# Patient Record
Sex: Male | Born: 1975 | Race: White | Hispanic: No | Marital: Married | Smoking: Former smoker
Health system: Southern US, Community
[De-identification: ages and names within clinical notes are randomized; demographics above are authoritative.]

## PROBLEM LIST (undated history)

## (undated) DIAGNOSIS — K219 Gastro-esophageal reflux disease without esophagitis: Secondary | ICD-10-CM

## (undated) DIAGNOSIS — E7800 Pure hypercholesterolemia, unspecified: Secondary | ICD-10-CM

## (undated) DIAGNOSIS — K760 Fatty (change of) liver, not elsewhere classified: Secondary | ICD-10-CM

## (undated) DIAGNOSIS — G4700 Insomnia, unspecified: Secondary | ICD-10-CM

## (undated) HISTORY — DX: Insomnia, unspecified: G47.00

## (undated) HISTORY — DX: Pure hypercholesterolemia, unspecified: E78.00

## (undated) HISTORY — DX: Fatty (change of) liver, not elsewhere classified: K76.0

---

## 1997-08-20 ENCOUNTER — Emergency Department (HOSPITAL_COMMUNITY): Admission: EM | Admit: 1997-08-20 | Discharge: 1997-08-21 | Payer: Self-pay | Admitting: Emergency Medicine

## 2005-02-13 ENCOUNTER — Emergency Department (HOSPITAL_COMMUNITY): Admission: EM | Admit: 2005-02-13 | Discharge: 2005-02-13 | Payer: Self-pay | Admitting: Emergency Medicine

## 2019-02-22 ENCOUNTER — Emergency Department (HOSPITAL_COMMUNITY): Payer: Commercial Managed Care - PPO | Admitting: Certified Registered"

## 2019-02-22 ENCOUNTER — Emergency Department (HOSPITAL_BASED_OUTPATIENT_CLINIC_OR_DEPARTMENT_OTHER): Payer: Commercial Managed Care - PPO

## 2019-02-22 ENCOUNTER — Other Ambulatory Visit: Payer: Self-pay

## 2019-02-22 ENCOUNTER — Observation Stay (HOSPITAL_BASED_OUTPATIENT_CLINIC_OR_DEPARTMENT_OTHER)
Admission: EM | Admit: 2019-02-22 | Discharge: 2019-02-23 | Disposition: A | Discharge status: Home / Self Care | Payer: Commercial Managed Care - PPO | Attending: General Surgery | Admitting: General Surgery

## 2019-02-22 ENCOUNTER — Encounter (HOSPITAL_COMMUNITY)
Admission: EM | Disposition: A | Discharge status: Home / Self Care | Payer: Self-pay | Source: Home / Self Care | Attending: Emergency Medicine

## 2019-02-22 ENCOUNTER — Encounter (HOSPITAL_BASED_OUTPATIENT_CLINIC_OR_DEPARTMENT_OTHER): Payer: Self-pay | Admitting: General Surgery

## 2019-02-22 DIAGNOSIS — K3580 Unspecified acute appendicitis: Principal | ICD-10-CM

## 2019-02-22 DIAGNOSIS — R109 Unspecified abdominal pain: Secondary | ICD-10-CM | POA: Diagnosis present

## 2019-02-22 DIAGNOSIS — K219 Gastro-esophageal reflux disease without esophagitis: Secondary | ICD-10-CM | POA: Diagnosis not present

## 2019-02-22 DIAGNOSIS — R1031 Right lower quadrant pain: Secondary | ICD-10-CM

## 2019-02-22 DIAGNOSIS — R110 Nausea: Secondary | ICD-10-CM

## 2019-02-22 DIAGNOSIS — Z20828 Contact with and (suspected) exposure to other viral communicable diseases: Secondary | ICD-10-CM | POA: Diagnosis not present

## 2019-02-22 DIAGNOSIS — Z87891 Personal history of nicotine dependence: Secondary | ICD-10-CM | POA: Insufficient documentation

## 2019-02-22 HISTORY — DX: Gastro-esophageal reflux disease without esophagitis: K21.9

## 2019-02-22 HISTORY — PX: LAPAROSCOPIC APPENDECTOMY: SHX408

## 2019-02-22 LAB — CBC WITH DIFFERENTIAL/PLATELET
Abs Immature Granulocytes: 0.03 10*3/uL (ref 0.00–0.07)
Basophils Absolute: 0.1 10*3/uL (ref 0.0–0.1)
Basophils Relative: 1 %
Eosinophils Absolute: 0.2 10*3/uL (ref 0.0–0.5)
Eosinophils Relative: 1 %
HCT: 44.5 % (ref 39.0–52.0)
Hemoglobin: 15.5 g/dL (ref 13.0–17.0)
Immature Granulocytes: 0 %
Lymphocytes Relative: 30 %
Lymphs Abs: 3.4 10*3/uL (ref 0.7–4.0)
MCH: 30.6 pg (ref 26.0–34.0)
MCHC: 34.8 g/dL (ref 30.0–36.0)
MCV: 87.8 fL (ref 80.0–100.0)
Monocytes Absolute: 1 10*3/uL (ref 0.1–1.0)
Monocytes Relative: 9 %
Neutro Abs: 6.8 10*3/uL (ref 1.7–7.7)
Neutrophils Relative %: 59 %
Platelets: 376 10*3/uL (ref 150–400)
RBC: 5.07 MIL/uL (ref 4.22–5.81)
RDW: 11.8 % (ref 11.5–15.5)
WBC: 11.4 10*3/uL — ABNORMAL HIGH (ref 4.0–10.5)
nRBC: 0 % (ref 0.0–0.2)

## 2019-02-22 LAB — COMPREHENSIVE METABOLIC PANEL
ALT: 93 U/L — ABNORMAL HIGH (ref 0–44)
AST: 43 U/L — ABNORMAL HIGH (ref 15–41)
Albumin: 4.5 g/dL (ref 3.5–5.0)
Alkaline Phosphatase: 70 U/L (ref 38–126)
Anion gap: 11 (ref 5–15)
BUN: 9 mg/dL (ref 6–20)
CO2: 26 mmol/L (ref 22–32)
Calcium: 9.5 mg/dL (ref 8.9–10.3)
Chloride: 101 mmol/L (ref 98–111)
Creatinine, Ser: 0.93 mg/dL (ref 0.61–1.24)
GFR calc Af Amer: >60 mL/min (ref >60)
GFR calc non Af Amer: >60 mL/min (ref >60)
Glucose, Bld: 106 mg/dL — ABNORMAL HIGH (ref 70–99)
Potassium: 4.1 mmol/L (ref 3.5–5.1)
Sodium: 138 mmol/L (ref 135–145)
Total Bilirubin: 1.5 mg/dL — ABNORMAL HIGH (ref 0.3–1.2)
Total Protein: 8.2 g/dL — ABNORMAL HIGH (ref 6.5–8.1)

## 2019-02-22 LAB — LIPASE, BLOOD: Lipase: 27 U/L (ref 11–51)

## 2019-02-22 LAB — RESPIRATORY PANEL BY RT PCR (FLU A&B, COVID)
Influenza A by PCR: NEGATIVE
Influenza B by PCR: NEGATIVE
SARS Coronavirus 2 by RT PCR: NEGATIVE

## 2019-02-22 LAB — SARS CORONAVIRUS 2 AG (30 MIN TAT): SARS Coronavirus 2 Ag: NEGATIVE

## 2019-02-22 LAB — URINALYSIS, ROUTINE W REFLEX MICROSCOPIC
Bilirubin Urine: NEGATIVE
Glucose, UA: NEGATIVE mg/dL
Hgb urine dipstick: NEGATIVE
Ketones, ur: NEGATIVE mg/dL
Leukocytes,Ua: NEGATIVE
Nitrite: NEGATIVE
Protein, ur: NEGATIVE mg/dL
Specific Gravity, Urine: 1.02 (ref 1.005–1.030)
pH: 6 (ref 5.0–8.0)

## 2019-02-22 SURGERY — APPENDECTOMY, LAPAROSCOPIC
Anesthesia: General

## 2019-02-22 MED ORDER — PROPOFOL 10 MG/ML IV BOLUS
INTRAVENOUS | Status: DC | PRN
  Administered 2019-02-22: 200 mg via INTRAVENOUS

## 2019-02-22 MED ORDER — SUCCINYLCHOLINE CHLORIDE 200 MG/10ML IV SOSY
PREFILLED_SYRINGE | INTRAVENOUS | Status: DC | PRN
  Administered 2019-02-22: 100 mg via INTRAVENOUS

## 2019-02-22 MED ORDER — ONDANSETRON HCL 4 MG/2ML IJ SOLN
INTRAMUSCULAR | Status: DC | PRN
  Administered 2019-02-22: 4 mg via INTRAVENOUS

## 2019-02-22 MED ORDER — FENTANYL CITRATE (PF) 250 MCG/5ML IJ SOLN
INTRAMUSCULAR | Status: AC
  Filled 2019-02-22: qty 5

## 2019-02-22 MED ORDER — IOHEXOL 300 MG/ML  SOLN
100.0000 mL | Freq: Once | INTRAMUSCULAR | Status: AC | PRN
  Administered 2019-02-22: 12:00:00 100 mL via INTRAVENOUS

## 2019-02-22 MED ORDER — FENTANYL CITRATE (PF) 100 MCG/2ML IJ SOLN: 25.0000 ug | INTRAMUSCULAR | Status: DC | PRN

## 2019-02-22 MED ORDER — SENNOSIDES-DOCUSATE SODIUM 8.6-50 MG PO TABS: 1.0000 | ORAL_TABLET | Freq: Every day | ORAL | Status: DC

## 2019-02-22 MED ORDER — SUGAMMADEX SODIUM 200 MG/2ML IV SOLN
INTRAVENOUS | Status: DC | PRN
  Administered 2019-02-22: 200 mg via INTRAVENOUS

## 2019-02-22 MED ORDER — MORPHINE SULFATE (PF) 2 MG/ML IV SOLN
2.0000 mg | Freq: Once | INTRAVENOUS | Status: AC
  Administered 2019-02-22: 2 mg via INTRAVENOUS
  Filled 2019-02-22: qty 1

## 2019-02-22 MED ORDER — ONDANSETRON HCL 4 MG/2ML IJ SOLN
4.0000 mg | Freq: Once | INTRAMUSCULAR | Status: AC
  Administered 2019-02-22: 4 mg via INTRAVENOUS
  Filled 2019-02-22: qty 2

## 2019-02-22 MED ORDER — BISACODYL 10 MG RE SUPP: 10.0000 mg | Freq: Every day | RECTAL | Status: DC | PRN

## 2019-02-22 MED ORDER — HYDROMORPHONE HCL 1 MG/ML IJ SOLN
INTRAMUSCULAR | Status: DC | PRN
  Administered 2019-02-22: .5 mg via INTRAVENOUS
  Administered 2019-02-22: 1 mg via INTRAVENOUS

## 2019-02-22 MED ORDER — LACTATED RINGERS IR SOLN
Status: DC | PRN
  Administered 2019-02-22: 1000 mL

## 2019-02-22 MED ORDER — OXYCODONE HCL 5 MG PO TABS: 5.0000 mg | ORAL_TABLET | Freq: Once | ORAL | Status: DC | PRN

## 2019-02-22 MED ORDER — OXYCODONE HCL 5 MG/5ML PO SOLN: 5.0000 mg | Freq: Once | ORAL | Status: DC | PRN

## 2019-02-22 MED ORDER — PHENYLEPHRINE 40 MCG/ML (10ML) SYRINGE FOR IV PUSH (FOR BLOOD PRESSURE SUPPORT)
PREFILLED_SYRINGE | INTRAVENOUS | Status: DC | PRN
  Administered 2019-02-22 (×2): 120 ug via INTRAVENOUS

## 2019-02-22 MED ORDER — BUPIVACAINE HCL (PF) 0.25 % IJ SOLN
INTRAMUSCULAR | Status: AC
  Filled 2019-02-22: qty 30

## 2019-02-22 MED ORDER — HYDROCODONE-ACETAMINOPHEN 5-325 MG PO TABS
1.0000 | ORAL_TABLET | ORAL | Status: DC | PRN
  Administered 2019-02-23: 11:00:00 1 via ORAL
  Administered 2019-02-23: 2 via ORAL
  Filled 2019-02-22: qty 2
  Filled 2019-02-22: qty 1

## 2019-02-22 MED ORDER — METRONIDAZOLE IN NACL 5-0.79 MG/ML-% IV SOLN
500.0000 mg | Freq: Once | INTRAVENOUS | Status: AC
  Administered 2019-02-22: 14:00:00 500 mg via INTRAVENOUS
  Filled 2019-02-22: qty 100

## 2019-02-22 MED ORDER — MORPHINE SULFATE (PF) 2 MG/ML IV SOLN
2.0000 mg | INTRAVENOUS | Status: DC | PRN
  Administered 2019-02-23: 07:00:00 4 mg via INTRAVENOUS
  Filled 2019-02-22: qty 2

## 2019-02-22 MED ORDER — SODIUM CHLORIDE 0.9 % IV SOLN
2.0000 g | Freq: Once | INTRAVENOUS | Status: AC
  Administered 2019-02-22: 13:00:00 2 g via INTRAVENOUS
  Filled 2019-02-22: qty 20

## 2019-02-22 MED ORDER — HYDROMORPHONE HCL 1 MG/ML IJ SOLN: 0.2500 mg | INTRAMUSCULAR | Status: DC | PRN

## 2019-02-22 MED ORDER — SODIUM CHLORIDE 0.9 % IV SOLN
INTRAVENOUS | Status: AC
  Filled 2019-02-22: qty 20

## 2019-02-22 MED ORDER — FENTANYL CITRATE (PF) 100 MCG/2ML IJ SOLN
INTRAMUSCULAR | Status: DC | PRN
  Administered 2019-02-22 (×2): 50 ug via INTRAVENOUS
  Administered 2019-02-22: 100 ug via INTRAVENOUS
  Administered 2019-02-22: 50 ug via INTRAVENOUS

## 2019-02-22 MED ORDER — ONDANSETRON 4 MG PO TBDP: 4.0000 mg | ORAL_TABLET | Freq: Four times a day (QID) | ORAL | Status: DC | PRN

## 2019-02-22 MED ORDER — DEXAMETHASONE SODIUM PHOSPHATE 10 MG/ML IJ SOLN
INTRAMUSCULAR | Status: DC | PRN
  Administered 2019-02-22: 10 mg via INTRAVENOUS

## 2019-02-22 MED ORDER — ENOXAPARIN SODIUM 40 MG/0.4ML ~~LOC~~ SOLN: 40.0000 mg | Freq: Every day | SUBCUTANEOUS | Status: DC

## 2019-02-22 MED ORDER — SIMETHICONE 80 MG PO CHEW
40.0000 mg | CHEWABLE_TABLET | Freq: Four times a day (QID) | ORAL | Status: DC | PRN
  Administered 2019-02-23 (×2): 40 mg via ORAL
  Filled 2019-02-22 (×2): qty 1

## 2019-02-22 MED ORDER — 0.9 % SODIUM CHLORIDE (POUR BTL) OPTIME
TOPICAL | Status: DC | PRN
  Administered 2019-02-22: 1000 mL

## 2019-02-22 MED ORDER — DIPHENHYDRAMINE HCL 50 MG/ML IJ SOLN: 12.5000 mg | Freq: Four times a day (QID) | INTRAMUSCULAR | Status: DC | PRN

## 2019-02-22 MED ORDER — ONDANSETRON HCL 4 MG/2ML IJ SOLN: 4.0000 mg | Freq: Four times a day (QID) | INTRAMUSCULAR | Status: DC | PRN

## 2019-02-22 MED ORDER — DEXMEDETOMIDINE HCL 200 MCG/2ML IV SOLN
INTRAVENOUS | Status: DC | PRN
  Administered 2019-02-22: 12 ug via INTRAVENOUS

## 2019-02-22 MED ORDER — LACTATED RINGERS IV SOLN
INTRAVENOUS | Status: DC
  Administered 2019-02-22 (×2): via INTRAVENOUS

## 2019-02-22 MED ORDER — ROCURONIUM BROMIDE 10 MG/ML (PF) SYRINGE
PREFILLED_SYRINGE | INTRAVENOUS | Status: DC | PRN
  Administered 2019-02-22: 40 mg via INTRAVENOUS
  Administered 2019-02-22: 20 mg via INTRAVENOUS

## 2019-02-22 MED ORDER — KETOROLAC TROMETHAMINE 30 MG/ML IJ SOLN: 30.0000 mg | Freq: Once | INTRAMUSCULAR | Status: DC | PRN

## 2019-02-22 MED ORDER — SODIUM CHLORIDE 0.9 % IV BOLUS
1000.0000 mL | Freq: Once | INTRAVENOUS | Status: AC
  Administered 2019-02-22: 12:00:00 1000 mL via INTRAVENOUS

## 2019-02-22 MED ORDER — KCL IN DEXTROSE-NACL 20-5-0.45 MEQ/L-%-% IV SOLN
INTRAVENOUS | Status: DC
  Administered 2019-02-22: 23:00:00 via INTRAVENOUS
  Filled 2019-02-22 (×2): qty 1000

## 2019-02-22 MED ORDER — MIDAZOLAM HCL 5 MG/5ML IJ SOLN
INTRAMUSCULAR | Status: DC | PRN
  Administered 2019-02-22: 2 mg via INTRAVENOUS

## 2019-02-22 MED ORDER — BUPIVACAINE HCL (PF) 0.25 % IJ SOLN
INTRAMUSCULAR | Status: DC | PRN
  Administered 2019-02-22: 15 mL

## 2019-02-22 MED ORDER — LIDOCAINE 2% (20 MG/ML) 5 ML SYRINGE
INTRAMUSCULAR | Status: DC | PRN
  Administered 2019-02-22: 60 mg via INTRAVENOUS

## 2019-02-22 SURGICAL SUPPLY — 61 items
ADH SKN CLS APL DERMABOND .7 (GAUZE/BANDAGES/DRESSINGS) ×1
APL PRP STRL LF DISP 70% ISPRP (MISCELLANEOUS) ×1
APPLIER CLIP 5 13 M/L LIGAMAX5 (MISCELLANEOUS)
APPLIER CLIP ROT 10 11.4 M/L (STAPLE)
APR CLP MED LRG 11.4X10 (STAPLE)
APR CLP MED LRG 5 ANG JAW (MISCELLANEOUS)
BAG SPEC RTRVL LRG 6X4 10 (ENDOMECHANICALS) ×1
CABLE HIGH FREQUENCY MONO STRZ (ELECTRODE) ×3 IMPLANT
CHLORAPREP W/TINT 26 (MISCELLANEOUS) ×3 IMPLANT
CLIP APPLIE 5 13 M/L LIGAMAX5 (MISCELLANEOUS) IMPLANT
CLIP APPLIE ROT 10 11.4 M/L (STAPLE) IMPLANT
COVER WAND RF STERILE (DRAPES) IMPLANT
CUTTER FLEX LINEAR 45M (STAPLE) IMPLANT
DECANTER SPIKE VIAL GLASS SM (MISCELLANEOUS) IMPLANT
DERMABOND ADVANCED (GAUZE/BANDAGES/DRESSINGS) ×2
DERMABOND ADVANCED .7 DNX12 (GAUZE/BANDAGES/DRESSINGS) ×1 IMPLANT
DRAPE LAPAROSCOPIC ABDOMINAL (DRAPES) IMPLANT
ELECT REM PT RETURN 15FT ADLT (MISCELLANEOUS) ×3 IMPLANT
ENDOLOOP SUT PDS II  0 18 (SUTURE)
ENDOLOOP SUT PDS II 0 18 (SUTURE) IMPLANT
GLOVE BIO SURGEON STRL SZ 6.5 (GLOVE) ×2 IMPLANT
GLOVE BIO SURGEON STRL SZ7.5 (GLOVE) IMPLANT
GLOVE BIO SURGEONS STRL SZ 6.5 (GLOVE) ×1
GLOVE BIOGEL PI IND STRL 7.0 (GLOVE) ×1 IMPLANT
GLOVE BIOGEL PI INDICATOR 7.0 (GLOVE) ×2
GOWN STRL REUS W/TWL XL LVL3 (GOWN DISPOSABLE) ×6 IMPLANT
GRASPER SUT TROCAR 14GX15 (MISCELLANEOUS) IMPLANT
HANDLE STAPLE EGIA 4 XL (STAPLE) IMPLANT
IRRIG SUCT STRYKERFLOW 2 WTIP (MISCELLANEOUS) ×3
IRRIGATION SUCT STRKRFLW 2 WTP (MISCELLANEOUS) ×1 IMPLANT
KIT BASIN OR (CUSTOM PROCEDURE TRAY) ×6 IMPLANT
KIT TURNOVER KIT A (KITS) ×3 IMPLANT
MARKER SKIN DUAL TIP RULER LAB (MISCELLANEOUS) IMPLANT
PENCIL SMOKE EVACUATOR (MISCELLANEOUS) IMPLANT
POUCH SPECIMEN RETRIEVAL 10MM (ENDOMECHANICALS) ×3 IMPLANT
RELOAD 45 VASCULAR/THIN (ENDOMECHANICALS) IMPLANT
RELOAD EGIA 45 MED/THCK PURPLE (STAPLE) IMPLANT
RELOAD EGIA 45 TAN VASC (STAPLE) IMPLANT
RELOAD EGIA 60 MED/THCK PURPLE (STAPLE) IMPLANT
RELOAD EGIA 60 TAN VASC (STAPLE) IMPLANT
RELOAD STAPLE TA45 3.5 REG BLU (ENDOMECHANICALS) IMPLANT
RELOAD STAPLER BLUE 60MM (STAPLE) ×3 IMPLANT
RELOAD STAPLER WHITE 60MM (STAPLE) ×1 IMPLANT
SCISSORS LAP 5X35 DISP (ENDOMECHANICALS) ×3 IMPLANT
SET IRRIG TUBING LAPAROSCOPIC (IRRIGATION / IRRIGATOR) ×3 IMPLANT
SET TUBE SMOKE EVAC HIGH FLOW (TUBING) ×3 IMPLANT
SHEARS HARMONIC ACE PLUS 36CM (ENDOMECHANICALS) IMPLANT
SLEEVE XCEL OPT CAN 5 100 (ENDOMECHANICALS) ×3 IMPLANT
STAPLE ECHEON FLEX 60 POW ENDO (STAPLE) ×3 IMPLANT
STAPLER RELOAD BLUE 60MM (STAPLE) ×9
STAPLER RELOAD WHITE 60MM (STAPLE) ×3
SUT MNCRL AB 4-0 PS2 18 (SUTURE) ×3 IMPLANT
SUT VIC AB 2-0 SH 27 (SUTURE)
SUT VIC AB 2-0 SH 27X BRD (SUTURE) IMPLANT
SUT VIC AB 4-0 PS2 27 (SUTURE) ×3 IMPLANT
SUT VICRYL 0 UR6 27IN ABS (SUTURE) IMPLANT
TOWEL OR 17X26 10 PK STRL BLUE (TOWEL DISPOSABLE) ×3 IMPLANT
TRAY FOLEY MTR SLVR 16FR STAT (SET/KITS/TRAYS/PACK) ×3 IMPLANT
TRAY LAPAROSCOPIC (CUSTOM PROCEDURE TRAY) ×3 IMPLANT
TROCAR BLADELESS OPT 5 100 (ENDOMECHANICALS) ×3 IMPLANT
TROCAR XCEL BLUNT TIP 100MML (ENDOMECHANICALS) ×3 IMPLANT

## 2019-02-22 NOTE — ED Notes (Signed)
Call to Santiago Glad at short stay OR at [facility], notified of possibility of covid result taking up to 4 hours.  Per [facility] provider, Dr Gilford Raid, ordering POC rapid covid to be processed in house lab for a preliminary result.  Receiving MD made aware of plan.

## 2019-02-22 NOTE — ED Notes (Signed)
Call to Pre-op short stay, notified of negative covid result.

## 2019-02-22 NOTE — H&P (Signed)
Jermaine Cross [birth year]  [id number].    Chief Complaint/Reason for Consult: acute appendicitis   HPI:  This is a 43 yo male with no significant past medical history who began having right-sided abdominal pain 2 days ago.  It is sharp in nature.  He has felt bloated and tried some miralax with his coffee but that didn't help.  He has had some nausea, but no emesis.  He denies any chest pain, fevers, cough, SOB, dysuria, etc.  He went to the urgent care who then sent him to [place] for further work up.  He has a WBC of 11K.  His LFTs are slightly elevated.  He has a CT Scan that reveals acute appendicitis with a 1.9cm appendicolith.  He was transferred here to [facility] for further surgical evaluation and management.  ROS: Review of Systems  Constitutional: Negative for chills, fever and weight loss.  HENT: Negative for hearing loss and tinnitus.   Eyes: Negative for blurred vision and double vision.  Respiratory: Negative for cough and shortness of breath.   Cardiovascular: Negative for chest pain and palpitations.  Gastrointestinal: Negative for abdominal pain, nausea and vomiting.  Genitourinary: Negative for dysuria, frequency and urgency.  Musculoskeletal: Negative for myalgias.  Skin: Negative for itching and rash.  Neurological: Negative for dizziness and headaches.  : Please see HPI, otherwise all other systems have been reviewed and are negative.  History reviewed. No pertinent family history.  Past Medical History:  Diagnosis Date  . GERD (gastroesophageal reflux disease)     History reviewed. No pertinent surgical history.  Social History:  reports that he has quit smoking. He has never used smokeless tobacco. No history on file for alcohol and drug.  Allergies: No Known Allergies  Medications Prior to Admission  Medication Sig Dispense Refill  . famotidine (PEPCID) 20 MG tablet Take by mouth.       Physical Exam: Blood pressure 128/86, pulse 86, temperature  98.2 F (36.8 C), temperature source Oral, resp. rate 20, height 6' (1.829 m), weight 96.3 kg, SpO2 100 %. General: pleasant, WD, WN white male who is laying in bed in NAD HEENT: head is normocephalic, atraumatic.  Sclera are noninjected.  PERRL.  Ears and nose without any masses or lesions.  Mouth is pink and moist Heart: regular, rate, and rhythm.  Normal s1,s2. No obvious murmurs, gallops, or rubs noted.  Palpable radial and pedal pulses bilaterally Lungs: CTAB, no wheezes, rhonchi, or rales noted.  Respiratory effort nonlabored Abd: soft, tender in RLQ at McBurney's point, ND, +BS, no masses, hernias, or organomegaly MS: all 4 extremities are symmetrical with no cyanosis, clubbing, or edema. Skin: warm and dry with no masses, lesions, or rashes Psych: A&Ox3 with an appropriate affect.   Results for orders placed or performed during the hospital encounter of 02/22/19 (from the past 48 hour(s))  CBC with Differential     Status: Abnormal   Collection Time: 02/22/19 10:57 AM  Result Value Ref Range   WBC 11.4 (H) 4.0 - 10.5 K/uL   RBC 5.07 4.22 - 5.81 MIL/uL   Hemoglobin 15.5 13.0 - 17.0 g/dL   HCT 44.5 39.0 - 52.0 %   MCV 87.8 80.0 - 100.0 fL   MCH 30.6 26.0 - 34.0 pg   MCHC 34.8 30.0 - 36.0 g/dL   RDW 11.8 11.5 - 15.5 %   Platelets 376 150 - 400 K/uL   nRBC 0.0 0.0 - 0.2 %   Neutrophils Relative %  59 %   Neutro Abs 6.8 1.7 - 7.7 K/uL   Lymphocytes Relative 30 %   Lymphs Abs 3.4 0.7 - 4.0 K/uL   Monocytes Relative 9 %   Monocytes Absolute 1.0 0.1 - 1.0 K/uL   Eosinophils Relative 1 %   Eosinophils Absolute 0.2 0.0 - 0.5 K/uL   Basophils Relative 1 %   Basophils Absolute 0.1 0.0 - 0.1 K/uL   Immature Granulocytes 0 %   Abs Immature Granulocytes 0.03 0.00 - 0.07 K/uL    Comment: Performed at [facility], [address] [place], [place] [address]  Comprehensive metabolic panel     Status: Abnormal   Collection Time: 02/22/19 10:57 AM  Result Value Ref Range    Sodium 138 135 - 145 mmol/L   Potassium 4.1 3.5 - 5.1 mmol/L   Chloride 101 98 - 111 mmol/L   CO2 26 22 - 32 mmol/L   Glucose, Bld 106 (H) 70 - 99 mg/dL   BUN 9 6 - 20 mg/dL   Creatinine, Ser 0.93 0.61 - 1.24 mg/dL   Calcium 9.5 8.9 - 10.3 mg/dL   Total Protein 8.2 (H) 6.5 - 8.1 g/dL   Albumin 4.5 3.5 - 5.0 g/dL   AST 43 (H) 15 - 41 U/L   ALT 93 (H) 0 - 44 U/L   Alkaline Phosphatase 70 38 - 126 U/L   Total Bilirubin 1.5 (H) 0.3 - 1.2 mg/dL   GFR calc non Af Amer >60 >60 mL/min   GFR calc Af Amer >60 >60 mL/min   Anion gap 11 5 - 15    Comment: Performed at [facility], [address] [place], [place] [address]  Lipase, blood     Status: None   Collection Time: 02/22/19 10:57 AM  Result Value Ref Range   Lipase 27 11 - 51 U/L    Comment: Performed at [facility], [address] [place], [place] [address]  Urinalysis, Routine w reflex microscopic     Status: None   Collection Time: 02/22/19 10:57 AM  Result Value Ref Range   Color, Urine YELLOW YELLOW   APPearance CLEAR CLEAR   Specific Gravity, Urine 1.020 1.005 - 1.030   pH 6.0 5.0 - 8.0   Glucose, UA NEGATIVE NEGATIVE mg/dL   Hgb urine dipstick NEGATIVE NEGATIVE   Bilirubin Urine NEGATIVE NEGATIVE   Ketones, ur NEGATIVE NEGATIVE mg/dL   Protein, ur NEGATIVE NEGATIVE mg/dL   Nitrite NEGATIVE NEGATIVE   Leukocytes,Ua NEGATIVE NEGATIVE    Comment: Microscopic not done on urines with negative protein, blood, leukocytes, nitrite, or glucose < 500 mg/dL. Performed at [facility], [address] [place], [place] [address]   Respiratory Panel by RT PCR (Flu A&B, Covid) - Nasopharyngeal Swab     Status: None   Collection Time: 02/22/19  1:18 PM   Specimen: Nasopharyngeal Swab  Result Value Ref Range   SARS Coronavirus 2 by RT PCR NEGATIVE NEGATIVE    Comment: (NOTE) SARS-CoV-2 target nucleic acids are NOT DETECTED. The SARS-CoV-2 RNA is generally detectable in upper respiratoy specimens  during the acute phase of infection. The lowest concentration of SARS-CoV-2 viral copies this assay can detect is 131 copies/mL. A negative result does not preclude SARS-Cov-2 infection and should not be used as the sole basis for treatment or other patient management decisions. A negative result may occur with  improper specimen collection/handling, submission of specimen other than  nasopharyngeal swab, presence of viral mutation(s) within the areas targeted by this assay, and inadequate number of viral copies (<131 copies/mL). A negative result must be combined with clinical observations, patient history, and epidemiological information. The expected result is Negative. Fact Sheet for Patients:  PinkCheek.be Fact Sheet for Healthcare Providers:  GravelBags.it This test is not yet ap proved or cleared by the [place] FDA and  has been authorized for detection and/or diagnosis of SARS-CoV-2 by FDA under an Emergency Use Authorization (EUA). This EUA will remain  in effect (meaning this test can be used) for the duration of the COVID-19 declaration under Section 564(b)(1) of the Act, 21 U.S.C. section 360bbb-3(b)(1), unless the authorization is terminated or revoked sooner.    Influenza A by PCR NEGATIVE NEGATIVE   Influenza B by PCR NEGATIVE NEGATIVE    Comment: (NOTE) The Xpert Xpress SARS-CoV-2/FLU/RSV assay is intended as an aid in  the diagnosis of influenza from Nasopharyngeal swab specimens and  should not be used as a sole basis for treatment. Nasal washings and  aspirates are unacceptable for Xpert Xpress SARS-CoV-2/FLU/RSV  testing. Fact Sheet for Patients: PinkCheek.be Fact Sheet for Healthcare Providers: GravelBags.it This test is not yet approved or cleared by the [place] FDA and  has been authorized for detection and/or diagnosis of SARS-CoV-2  by  FDA under an Emergency Use Authorization (EUA). This EUA will remain  in effect (meaning this test can be used) for the duration of the  Covid-19 declaration under Section 564(b)(1) of the Act, 21  U.S.C. section 360bbb-3(b)(1), unless the authorization is  terminated or revoked. Performed at [facility], [place] [address], [place], [place] [address]   SARS Coronavirus 2 Ag (30 min TAT) - Nasal Swab (BD Veritor Kit)     Status: None   Collection Time: 02/22/19  3:13 PM   Specimen: Nasal Swab (BD Veritor Kit)  Result Value Ref Range   SARS Coronavirus 2 Ag NEGATIVE NEGATIVE    Comment: (NOTE) SARS-CoV-2 antigen NOT DETECTED.  Negative results are presumptive.  Negative results do not preclude SARS-CoV-2 infection and should not be used as the sole basis for treatment or other patient management decisions, including infection  control decisions, particularly in the presence of clinical signs and  symptoms consistent with COVID-19, or in those who have been in contact with the virus.  Negative results must be combined with clinical observations, patient history, and epidemiological information. The expected result is Negative. Fact Sheet for Patients: PodPark.tn Fact Sheet for Healthcare Providers: GiftContent.is This test is not yet approved or cleared by the [place] FDA and  has been authorized for detection and/or diagnosis of SARS-CoV-2 by FDA under an Emergency Use Authorization (EUA).  This EUA will remain in effect (meaning this test can be used) for the duration of  the COVID-19 de claration under Section 564(b)(1) of the Act, 21 U.S.C. section 360bbb-3(b)(1), unless the authorization is terminated or revoked sooner. Performed at [facility], [address] [place], [place] [address]    CT ABDOMEN PELVIS W CONTRAST  Result Date: 02/22/2019 CLINICAL DATA:  Right lower quadrant abdomen  pain for 3 days. EXAM: CT ABDOMEN AND PELVIS WITH CONTRAST TECHNIQUE: Multidetector CT imaging of the abdomen and pelvis was performed using the standard protocol following bolus administration of intravenous contrast. CONTRAST:  188m OMNIPAQUE IOHEXOL 300 MG/ML  SOLN COMPARISON:  None. FINDINGS: Lower chest: No acute abnormality. Hepatobiliary: Diffuse low density of liver is identified.  No focal lesion is identified in the liver. The gallbladder and biliary tree are normal. Pancreas: Unremarkable. No pancreatic ductal dilatation or surrounding inflammatory changes. Spleen: Normal in size without focal abnormality. Adrenals/Urinary Tract: The bilateral adrenal glands are normal. There is a small cyst in the left kidney. There is no hydronephrosis bilaterally. The bladder is normal. Stomach/Bowel: The appendix is enlarged with several large appendiculith, largest measures 1.9 cm. There is no small bowel obstruction. The stomach is normal. The colon is normal. Vascular/Lymphatic: No significant vascular findings are present. No enlarged abdominal or pelvic lymph nodes. Reproductive: Prostate is unremarkable. Other: Minimal free fluid is identified in the pelvis. Musculoskeletal: Mild degenerative joint changes of the spine are noted. IMPRESSION: 1. The appendix is enlarged with several large appendiculith, largest measures 1.9 cm. The findings are consistent with acute appendicitis. 2. Fatty infiltration of liver. Electronically Signed   By: Abelardo Diesel M.D.   On: 02/22/2019 12:36      Assessment/Plan Acute appendicitis We will plan to proceed with a lap appy. He has received rocephin/flagyl already. I have discussed the procedure and risks of appendectomy. The risks include but are not limited to bleeding, infection, wound problems, anesthesia, injury to intra-abdominal organs, possibility of postoperative ileus. He seems to understand and agrees with the plan.   Rosario Adie, MD  [facility]

## 2019-02-22 NOTE — Progress Notes (Signed)
Patient arrived form the PACU at approximately 2145. He is alert and verbally responsive but drowsy. No bleeding noted from lap sites.

## 2019-02-22 NOTE — Anesthesia Postprocedure Evaluation (Signed)
Anesthesia Post Note  Patient: Jermaine Cross  Procedure(s) Performed: APPENDECTOMY LAPAROSCOPIC (N/A )     Patient location during evaluation: PACU Anesthesia Type: General Level of consciousness: awake and alert, oriented and patient cooperative Pain management: pain level controlled Vital Signs Assessment: post-procedure vital signs reviewed and stable Respiratory status: spontaneous breathing, nonlabored ventilation and respiratory function stable Cardiovascular status: blood pressure returned to baseline and stable Postop Assessment: no apparent nausea or vomiting Anesthetic complications: no    Last Vitals:  Vitals:   02/22/19 1744 02/22/19 2045  BP: 128/86   Pulse: 86   Resp: 20 (!) 9  Temp:  36.6 C  SpO2: 100%     Last Pain:  Vitals:   02/22/19 2045  TempSrc:   PainSc: 0-No pain                 Pervis Hocking

## 2019-02-22 NOTE — ED Notes (Signed)
Pt transported via private vehicle to [facility] for Pre-op.  Iv in place to left [facility], wrapped for safety for transport

## 2019-02-22 NOTE — ED Triage Notes (Signed)
Pt states pain right lower quadrant abdomen for past 3 days, constant.  Seen at urgent care this morning, sent for further eval.  Hx dyspepsia.  Denies vomiting, denies fevers.

## 2019-02-22 NOTE — Anesthesia Preprocedure Evaluation (Addendum)
Anesthesia Evaluation  Patient identified by MRN, date of birth, ID band Patient awake    Reviewed: Allergy & Precautions, H&P , NPO status , Patient's Chart, lab work & pertinent test results  Airway Mallampati: II   Neck ROM: full    Dental no notable dental hx. (+) Teeth Intact, Dental Advisory Given   Pulmonary former smoker,  Quit 4-5 years ago, 1ppd x 15 years   breath sounds clear to auscultation       Cardiovascular negative cardio ROS   Rhythm:regular Rate:Normal     Neuro/Psych negative neurological ROS  negative psych ROS   GI/Hepatic Neg liver ROS, GERD  Medicated and Controlled,appendicitis   Endo/Other  negative endocrine ROS  Renal/GU negative Renal ROS  negative genitourinary   Musculoskeletal negative musculoskeletal ROS (+)   Abdominal (+)  Abdomen: tender.    Peds negative pediatric ROS (+)  Hematology negative hematology ROS (+)   Anesthesia Other Findings   Reproductive/Obstetrics negative OB ROS                           Anesthesia Physical Anesthesia Plan  ASA: II and emergent  Anesthesia Plan: General   Post-op Pain Management:    Induction: Intravenous  PONV Risk Score and Plan: 2 and Ondansetron, Dexamethasone, Midazolam and Treatment may vary due to age or medical condition  Airway Management Planned: Oral ETT  Additional Equipment: None  Intra-op Plan:   Post-operative Plan: Extubation in OR  Informed Consent: I have reviewed the patients History and Physical, chart, labs and discussed the procedure including the risks, benefits and alternatives for the proposed anesthesia with the patient or authorized representative who has indicated his/her understanding and acceptance.     Dental advisory given  Plan Discussed with: CRNA  Anesthesia Plan Comments:        Anesthesia Quick Evaluation

## 2019-02-22 NOTE — Discharge Instructions (Addendum)
You received antibiotics during your visit.  Your rapid Covid test was negative.   Please drive over to [facility], address is attached to your chart.  Please go to the preop area.

## 2019-02-22 NOTE — Op Note (Signed)
Rosie Golson [id number]   PRE-OPERATIVE DIAGNOSIS:  appendicitis  POST-OPERATIVE DIAGNOSIS:  Acute appendicitis   Procedure(s): APPENDECTOMY LAPAROSCOPIC    Surgeon(s): Romie Levee, MD  ASSISTANT: none   ANESTHESIA:   local and general  EBL:   30 ml  Delay start of Pharmacological VTE agent (>24hrs) due to surgical blood loss or risk of bleeding:  no  DRAINS: none   SPECIMEN:  Source of Specimen:  appendix  DISPOSITION OF SPECIMEN:  PATHOLOGY  COUNTS:  YES  PLAN OF CARE: Admit for overnight observation  PATIENT DISPOSITION:  PACU - hemodynamically stable.   INDICATIONS: Patient with concerning symptoms & work up suspicious for appendicitis.  Surgery was recommended:  The anatomy & physiology of the digestive tract was discussed.  The pathophysiology of appendicitis was discussed.  Natural history risks without surgery was discussed.   I feel the risks of no intervention will lead to serious problems that outweigh the operative risks; therefore, I recommended diagnostic laparoscopy with removal of appendix to remove the pathology.  Laparoscopic & open techniques were discussed.   I noted a good likelihood this will help address the problem.    Risks such as bleeding, infection, abscess, leak, reoperation, possible ostomy, hernia, heart attack, death, and other risks were discussed.  Goals of post-operative recovery were discussed as well.  We will work to minimize complications.  Questions were answered.  The patient expresses understanding & wishes to proceed with surgery.  OR FINDINGS: acute appendicitis  DESCRIPTION:   The patient was identified & brought into the operating room. The patient was positioned supine with left arm tucked. SCDs were active during the entire case. The patient underwent general anesthesia without any difficulty.  A foley catheter was inserted under sterile conditions. The abdomen was prepped and draped in a sterile fashion. A Surgical  Timeout confirmed our plan.   I made a transverse incision through the superior umbilical fold.  I made a nick in the infraumbilical fascia and confirmed peritoneal entry.  I placed a stay suture and then the [facility] port.  We induced carbon dioxide insufflation.  Camera inspection revealed no injury.  I placed additional ports under direct laparoscopic visualization.  I mobilized the terminal ileum to proximal ascending colon in a lateral to medial fashion.  I took care to avoid injuring any retroperitoneal structures.   I freed the appendix off its attachments to the ascending colon and cecal mesentery.  I elevated the appendix.  I was able to free off the base of the appendix, which was still viable.  I stapled the appendix off the cecum using a laparoscopic blue load stapler x2.  I took a healthy cuff viable cecum. I ligated the mesoappendix with a white load stapler.   I placed the appendix inside an EndoCatch bag and removed out the [place] port.  I did copious irrigation. Hemostasis was good in the mesoappendix, colon mesentery, and retroperitoneum. Staple line was intact on the cecum with no bleeding. I washed out the pelvis, retrohepatic space and right paracolic gutter.  Hemostasis is good. There was no perforation or injury.  Because the area cleaned up well after irrigation, I did not place a drain.  I aspirated the carbon dioxide. I removed the ports. I closed the umbilical fascia site using a 0 Vicryl stitch. I closed skin using 4-0 vicryl stitch.  Sterile dressings were applied.  Patient was extubated and sent to the recovery room.  I discussed the operative findings with the patient's family.  I suspect the patient is going used in the hospital at least overnight and will need antibiotics for 0 more days. Questions answered. They expressed understanding and appreciation.

## 2019-02-22 NOTE — Transfer of Care (Signed)
Immediate Anesthesia Transfer of Care Note  Patient: Jermaine Cross  Procedure(s) Performed: APPENDECTOMY LAPAROSCOPIC (N/A )  Patient Location: PACU  Anesthesia Type:General  Level of Consciousness: awake, alert  and patient cooperative  Airway & Oxygen Therapy: Patient Spontanous Breathing and Patient connected to face mask oxygen  Post-op Assessment: Report given to RN and Post -op Vital signs reviewed and stable  Post vital signs: Reviewed and stable  Last Vitals:  Vitals Value Taken Time  BP 140/87 02/22/19 2045  Temp    Pulse 85 02/22/19 2046  Resp 11 02/22/19 2046  SpO2 100 % 02/22/19 2046  Vitals shown include unvalidated device data.  Last Pain:  Vitals:   02/22/19 1744  TempSrc: Oral  PainSc: 3          Complications: No apparent anesthesia complications

## 2019-02-22 NOTE — ED Provider Notes (Signed)
[place] EMERGENCY DEPARTMENT Provider Note   CSN: [id number] Arrival date & time: 02/22/19  1019     History Chief Complaint  Patient presents with  . Abdominal Pain    Jermaine Cross is a 43 y.o. male.  43 y.o male with a PMH of dyspepsia presents to the ED with a chief complaint of right sided abdominal pain x 2 days.  Jermaine Cross describes a sharp sensation to the right lower abdomen, Jermaine Cross feels like his stomach is somewhat bloated, has had increase in gas.  The pain is exacerbated with palpation.  Jermaine Cross has taken some MiraLAX this morning along with his coffee without any improvement in symptoms.  Reports his last bowel movement was yesterday, no blood.  Jermaine Cross was seen at urgent care today and referred to the ED for further evaluation of his right lower quadrant pain.  Jermaine Cross also endorses nausea, reports Jermaine Cross is usually nauseated throughout the day but has not any episodes of vomiting.  Last oral intake was prior to arrival consisted of coffee.  Jermaine Cross denies any fever, diarrhea, URI symptoms. Occasional drinker, no prior surgical history to his abdomen.  The history is provided by the patient and medical records.  Abdominal Pain Associated symptoms: nausea   Associated symptoms: no chest pain, no constipation, no fever, no shortness of breath, no sore throat and no vomiting          Social History   Tobacco Use  . Smoking status: Not on file  Substance Use Topics  . Alcohol use: Not on file  . Drug use: Not on file    Home Medications Prior to Admission medications   Medication Sig Start Date End Date Taking? Authorizing Provider  famotidine (PEPCID) 20 MG tablet Take by mouth.    [provider]    Allergies    Patient has no known allergies.  Review of Systems   Review of Systems  Constitutional: Negative for fever.  HENT: Negative for sinus pressure and sore throat.   Respiratory: Negative for shortness of breath.   Cardiovascular: Negative for chest pain.    Gastrointestinal: Positive for abdominal pain and nausea. Negative for blood in stool, constipation and vomiting.  Genitourinary: Negative for flank pain.  Musculoskeletal: Negative for back pain and myalgias.  Skin: Negative for pallor and wound.  Neurological: Negative for light-headedness and headaches.    Physical Exam Updated Vital Signs BP (!) 145/100 (BP Location: Right Arm)   Pulse 91   Temp 98.3 F (36.8 C) (Oral)   Resp 16   SpO2 98%   Physical Exam Vitals and nursing note reviewed.  Constitutional:      Appearance: Jermaine Cross is well-developed. Jermaine Cross is not ill-appearing.  HENT:     Head: Normocephalic and atraumatic.  Eyes:     General: No scleral icterus.    Pupils: Pupils are equal, round, and reactive to light.  Cardiovascular:     Heart sounds: Normal heart sounds.  Pulmonary:     Effort: Pulmonary effort is normal.     Breath sounds: Normal breath sounds. No wheezing.  Chest:     Chest wall: No tenderness.  Abdominal:     General: Bowel sounds are normal. There is distension.     Palpations: Abdomen is soft.     Tenderness: There is abdominal tenderness in the right upper quadrant and right lower quadrant. There is guarding. There is no right CVA tenderness or left CVA tenderness. Positive signs include McBurney's sign. Negative signs include  Murphy's sign and Rovsing's sign.     Comments: Tenderness to palpation along the right quadrant.  Bowel sounds are present and normal, abdomen appears somewhat distended.  There is mild guarding on my exam.  Musculoskeletal:        General: No tenderness or deformity.     Cervical back: Normal range of motion.  Skin:    General: Skin is warm and dry.  Neurological:     Mental Status: Jermaine Cross is alert and oriented to person, place, and time.     ED Results / Procedures / Treatments   Labs (all labs ordered are listed, but only abnormal results are displayed) Labs Reviewed  CBC WITH DIFFERENTIAL/PLATELET - Abnormal; Notable  for the following components:      Result Value   WBC 11.4 (*)    All other components within normal limits  COMPREHENSIVE METABOLIC PANEL - Abnormal; Notable for the following components:   Glucose, Bld 106 (*)    Total Protein 8.2 (*)    AST 43 (*)    ALT 93 (*)    Total Bilirubin 1.5 (*)    All other components within normal limits  SARS CORONAVIRUS 2 AG (30 MIN TAT)  RESPIRATORY PANEL BY RT PCR (FLU A&B, COVID)  LIPASE, BLOOD  URINALYSIS, ROUTINE W REFLEX MICROSCOPIC    EKG None  Radiology CT ABDOMEN PELVIS W CONTRAST  Result Date: 02/22/2019 CLINICAL DATA:  Right lower quadrant abdomen pain for 3 days. EXAM: CT ABDOMEN AND PELVIS WITH CONTRAST TECHNIQUE: Multidetector CT imaging of the abdomen and pelvis was performed using the standard protocol following bolus administration of intravenous contrast. CONTRAST:  OMNIPAQUE IOHEXOL 300 MG/ML  SOLN COMPARISON:  None. FINDINGS: Lower chest: No acute abnormality. Hepatobiliary: Diffuse low density of liver is identified. No focal lesion is identified in the liver. The gallbladder and biliary tree are normal. Pancreas: Unremarkable. No pancreatic ductal dilatation or surrounding inflammatory changes. Spleen: Normal in size without focal abnormality. Adrenals/Urinary Tract: The bilateral adrenal glands are normal. There is a small cyst in the left kidney. There is no hydronephrosis bilaterally. The bladder is normal. Stomach/Bowel: The appendix is enlarged with several large appendiculith, largest measures 1.9 cm. There is no small bowel obstruction. The stomach is normal. The colon is normal. Vascular/Lymphatic: No significant vascular findings are present. No enlarged abdominal or pelvic lymph nodes. Reproductive: Prostate is unremarkable. Other: Minimal free fluid is identified in the pelvis. Musculoskeletal: Mild degenerative joint changes of the spine are noted. IMPRESSION: 1. The appendix is enlarged with several large appendiculith,  largest measures 1.9 cm. The findings are consistent with acute appendicitis. 2. Fatty infiltration of liver. Electronically Signed   By: Sherian Rein M.D.   On: 02/22/2019 12:36    Procedures Procedures (including critical care time)  Medications Ordered in ED Medications  sodium chloride 0.9 % with cefTRIAXone (ROCEPHIN) ADS Med (  Not Given 02/22/19 1322)  ondansetron (ZOFRAN) injection 4 mg (4 mg Intravenous Given 02/22/19 1123)  sodium chloride 0.9 % bolus 1,000 mL (0 mLs Intravenous Stopped 02/22/19 1348)  iohexol (OMNIPAQUE) 300 MG/ML solution 100 mL (100 mLs Intravenous Contrast Given 02/22/19 1214)  cefTRIAXone (ROCEPHIN) 2 g in sodium chloride 0.9 % 100 mL IVPB (0 g Intravenous Stopped 02/22/19 1348)    And  metroNIDAZOLE (FLAGYL) IVPB 500 mg (0 mg Intravenous Stopped 02/22/19 1452)  morphine 2 MG/ML injection 2 mg (2 mg Intravenous Given 02/22/19 1500)    ED Course  I have reviewed the  triage vital signs and the nursing notes.  Pertinent labs & imaging results that were available during my care of the patient were reviewed by me and considered in my medical decision making (see chart for details).    MDM Rules/Calculators/A&P    Patient with a past medical history of dyspepsia presents to the ED with complaints of right lower quadrant pain for the past 2 days.  Seen in urgent care prior to arrival, sent in for further evaluation.  Patient reports Jermaine Cross feels somewhat bloated, has had increasing gas.  There is significant tenderness to palpation on the right lower quadrant.  Jermaine Cross does report regular bowel movements, no fevers, urinary symptoms, URI symptoms.  During my evaluation there is mild guarding on exam, significant tenderness to location along the right lower quadrant, bowel sounds are present.  Abdomen is somewhat distended.  Vitals are within normal limits. Differential diagnoses included but not limited to appendicitis versus viral enteritis versus cholelithiasis.  Will  obtain blood work along with CT of the abdomen to further evaluate patient's complaint.  Last oral intake was this morning which consisted of coffee.  CBC with slight leukocytosis at 11.4.  Lipase level is within normal limits.  CMP remarkable for some mild elevation in his LFTs AST 43, ALT is 93.  CT Abdomen showed: 1. The appendix is enlarged with several large appendiculith,  largest measures 1.9 cm. The findings are consistent with acute  appendicitis.  2. Fatty infiltration of liver.     1:00 PM Spoke to Dr. Carolynne Edouardoth, general [facility], patient will need negative Covid testing prior to transfer.  Jermaine Cross is to proceed over to the preop area at [facility].  Jermaine Cross is currently pending Covid testing, antibiotics of Flagyl and Rocephin have been started.  3:04 PM patient has received Rocephin, Flagyl for appendicitis Jermaine Cross is currently awaiting Covid testing.  Due to delay in COVID-19 testing results, Jermaine Cross will obtain a rapid test prior to disposition to the preop area at [facility].   3:44 PM rapid Covid test is negative.  Patient is to be seen at preop, Jermaine Cross will be driving there via POV.  Vitals are stable, reports his pain is improved after 2 of morphine.  Patient stable for preop evaluation.  Portions of this note were generated with Scientist, clinical (histocompatibility and immunogenetics)Dragon dictation software. Dictation errors may occur despite best attempts at proofreading.  Final Clinical Impression(s) / ED Diagnoses Final diagnoses:  Right lower quadrant abdominal pain  Nausea  Acute appendicitis without peritonitis    Rx / DC Orders ED Discharge Orders    None       Claude MangesSoto, Lovinia Snare, PA-C 02/22/19 1544    Jacalyn LefevreHaviland, Julie, MD 02/23/19 [phone]

## 2019-02-22 NOTE — ED Notes (Signed)
Pt states drank coffee only this morning, no food.

## 2019-02-22 NOTE — Addendum Note (Signed)
Addendum  created 02/22/19 2106 by Pervis Hocking, DO   Order list changed

## 2019-02-22 NOTE — ED Notes (Signed)
Patient transported to CT 

## 2019-02-22 NOTE — Anesthesia Procedure Notes (Signed)
Procedure Name: Intubation Date/Time: 02/22/2019 7:37 PM Performed by: Pervis Hocking, DO Pre-anesthesia Checklist: Patient identified, Emergency Drugs available, Suction available and Patient being monitored Patient Re-evaluated:Patient Re-evaluated prior to induction Oxygen Delivery Method: Circle system utilized Preoxygenation: Pre-oxygenation with 100% oxygen Induction Type: IV induction Ventilation: Mask ventilation without difficulty Laryngoscope Size: Mac and 3 Grade View: Grade II Tube type: Oral Tube size: 7.5 mm Number of attempts: 2 Airway Equipment and Method: Stylet and Oral airway Placement Confirmation: ETT inserted through vocal cords under direct vision,  positive ETCO2 and breath sounds checked- equal and bilateral Secured at: 24 cm Tube secured with: Tape Dental Injury: Teeth and Oropharynx as per pre-operative assessment  Comments: DLx2 with rapid desaturation between attempts, poor reserve. Grade 2b view at best

## 2019-02-22 NOTE — ED Notes (Signed)
Dr Gilford Raid approved pt to be transported by private vehicle as soon as Short Stay Pre-op gives approval with covid test pending.  IV to remain in place per MD.  Pt and family aware and agree to go straight to hospital, without stopping, without eating or drinking en route.

## 2019-02-23 MED ORDER — IBUPROFEN 800 MG PO TABS: 800.0000 mg | ORAL_TABLET | Freq: Three times a day (TID) | ORAL | 0 refills | Status: AC | PRN

## 2019-02-23 MED ORDER — FAMOTIDINE 20 MG PO TABS: 40.0000 mg | ORAL_TABLET | Freq: Every day | ORAL | Status: DC

## 2019-02-23 MED ORDER — HYDROCODONE-ACETAMINOPHEN 5-325 MG PO TABS: 1.0000 | ORAL_TABLET | Freq: Four times a day (QID) | ORAL | 0 refills | Status: DC | PRN

## 2019-02-23 MED ORDER — FAMOTIDINE 20 MG PO TABS
20.0000 mg | ORAL_TABLET | Freq: Every day | ORAL | Status: DC
  Administered 2019-02-23: 20 mg via ORAL
  Filled 2019-02-23: qty 1

## 2019-02-23 NOTE — Progress Notes (Signed)
Md notified of pt need for increased gerd medication. Pt is currently on less than home dose.

## 2019-02-23 NOTE — Discharge Summary (Signed)
Physician Discharge Summary  Calib Wadhwa SRP:[id number] DOB: [birth year] DOA: 02/22/2019  PCP: Patient, No Pcp Per  Admit date: 02/22/2019 Discharge date: 02/23/2019  Recommendations for Outpatient Follow-up:  1. none (include homehealth, outpatient follow-up instructions, specific recommendations for PCP to follow-up on, etc.)  Follow-up Information    [facility].   Contact information: [address] [place] [address] [phone]         Discharge Diagnoses:  Active Problems:   Acute appendicitis   Surgical Procedure: lap appendectomy  Discharge Condition: Good Disposition: Home  Diet recommendation: regular diet   Hospital Course:  43 yo male presented with acute appendicitis. He underwent lap appendectomy and was brought into the hospital post op. He did well, tolerated a diet and ambulated well. He was discharged home POD 1.  Discharge Instructions  Discharge Instructions    Call MD for:  difficulty breathing, headache or visual disturbances   Complete by: As directed    Call MD for:  hives   Complete by: As directed    Call MD for:  persistant nausea and vomiting   Complete by: As directed    Call MD for:  redness, tenderness, or signs of infection (pain, swelling, redness, odor or green/yellow discharge around incision site)   Complete by: As directed    Call MD for:  severe uncontrolled pain   Complete by: As directed    Call MD for:  temperature >100.4   Complete by: As directed    Diet - low sodium heart healthy   Complete by: As directed    Discharge wound care:   Complete by: As directed    Ok to shower tomorrow. Glue will likely peel off in 1-3 weeks. No bandage required   Driving Restrictions   Complete by: As directed    No driving while on narcotics   Increase activity slowly   Complete by: As directed    Lifting restrictions   Complete by: As directed    No lifting greater than 20 pounds for 3  weeks     Allergies as of 02/23/2019   No Known Allergies     Medication List    TAKE these medications   famotidine 20 MG tablet Commonly known as: PEPCID Take by mouth.   HYDROcodone-acetaminophen 5-325 MG tablet Commonly known as: NORCO/VICODIN Take 1 tablet by mouth every 6 (six) hours as needed for moderate pain.   ibuprofen 800 MG tablet Commonly known as: ADVIL Take 1 tablet (800 mg total) by mouth every 8 (eight) hours as needed.            Discharge Care Instructions  (From admission, onward)         Start     Ordered   02/23/19 0000  Discharge wound care:    Comments: Ok to shower tomorrow. Glue will likely peel off in 1-3 weeks. No bandage required   02/23/19 0913         Follow-up Information    [facility].   Contact information: [address] [place] [address] [phone]           The results of significant diagnostics from this hospitalization (including imaging, microbiology, ancillary and laboratory) are listed below for reference.    Significant Diagnostic Studies: CT ABDOMEN PELVIS W CONTRAST  Result Date: 02/22/2019 CLINICAL DATA:  Right lower quadrant abdomen pain for 3 days. EXAM: CT ABDOMEN AND PELVIS WITH CONTRAST TECHNIQUE: Multidetector CT imaging  of the abdomen and pelvis was performed using the standard protocol following bolus administration of intravenous contrast. CONTRAST:  131mL OMNIPAQUE IOHEXOL 300 MG/ML  SOLN COMPARISON:  None. FINDINGS: Lower chest: No acute abnormality. Hepatobiliary: Diffuse low density of liver is identified. No focal lesion is identified in the liver. The gallbladder and biliary tree are normal. Pancreas: Unremarkable. No pancreatic ductal dilatation or surrounding inflammatory changes. Spleen: Normal in size without focal abnormality. Adrenals/Urinary Tract: The bilateral adrenal glands are normal. There is a small cyst in the left kidney. There is no  hydronephrosis bilaterally. The bladder is normal. Stomach/Bowel: The appendix is enlarged with several large appendiculith, largest measures 1.9 cm. There is no small bowel obstruction. The stomach is normal. The colon is normal. Vascular/Lymphatic: No significant vascular findings are present. No enlarged abdominal or pelvic lymph nodes. Reproductive: Prostate is unremarkable. Other: Minimal free fluid is identified in the pelvis. Musculoskeletal: Mild degenerative joint changes of the spine are noted. IMPRESSION: 1. The appendix is enlarged with several large appendiculith, largest measures 1.9 cm. The findings are consistent with acute appendicitis. 2. Fatty infiltration of liver. Electronically Signed   By: Abelardo Diesel M.D.   On: 02/22/2019 12:36    Labs: Basic Metabolic Panel: Recent Labs  Lab 02/22/19 1057  NA 138  K 4.1  CL 101  CO2 26  GLUCOSE 106*  BUN 9  CREATININE 0.93  CALCIUM 9.5   Liver Function Tests: Recent Labs  Lab 02/22/19 1057  AST 43*  ALT 93*  ALKPHOS 70  BILITOT 1.5*  PROT 8.2*  ALBUMIN 4.5    CBC: Recent Labs  Lab 02/22/19 1057  WBC 11.4*  NEUTROABS 6.8  HGB 15.5  HCT 44.5  MCV 87.8  PLT 376    CBG: No results for input(s): GLUCAP in the last 168 hours.  Active Problems:   Acute appendicitis   Time coordinating discharge: 15 min

## 2019-02-23 NOTE — Progress Notes (Signed)
Pt was able to ambulate in hall for a whole loop around the unit without complication. Pt was also able to pass gas as well.

## 2019-02-25 ENCOUNTER — Encounter: Payer: Self-pay | Admitting: *Deleted

## 2019-02-25 LAB — SURGICAL PATHOLOGY

## 2019-05-30 ENCOUNTER — Ambulatory Visit: Payer: Commercial Managed Care - PPO | Attending: Internal Medicine

## 2020-05-15 IMAGING — CT CT ABD-PELV W/ CM
2 of 5 series · 16 of 46 positions shown, 18 images · IV contrast (Omnipaque)
Comparison: None.

CLINICAL DATA: Right lower quadrant abdomen pain for 3 days.

EXAM:
CT ABDOMEN AND PELVIS WITH CONTRAST
TECHNIQUE: Multidetector CT imaging of the abdomen and pelvis was performed
using the standard protocol following bolus administration of
intravenous contrast.
CONTRAST:  100mL OMNIPAQUE IOHEXOL 300 MG/ML  SOLN

[Series 2: axial st · axial · 0.77mm/px · z∈[-502,-42]mm · 13 of 104 slices shown, 15 images]
[im 6/104  soft-tissue]
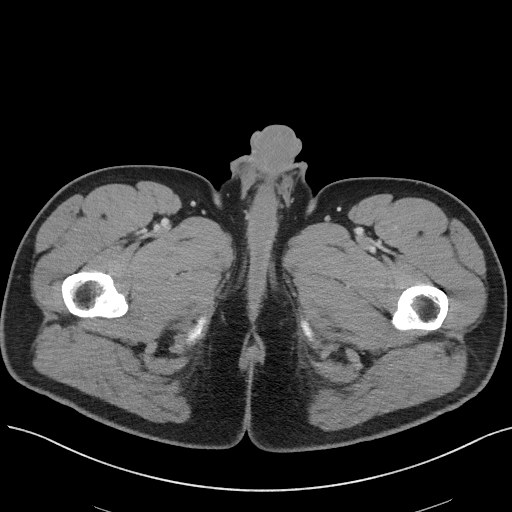
[im 6/104  bone]
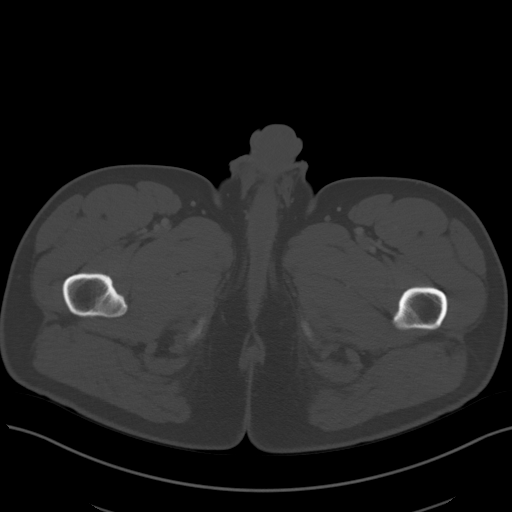
[im 17/104  soft-tissue]
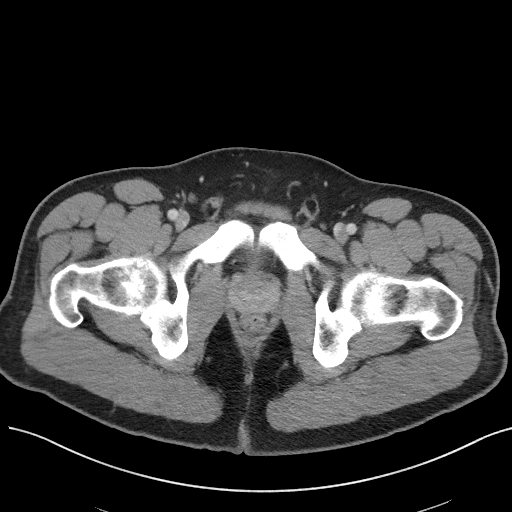
[im 22/104  soft-tissue]
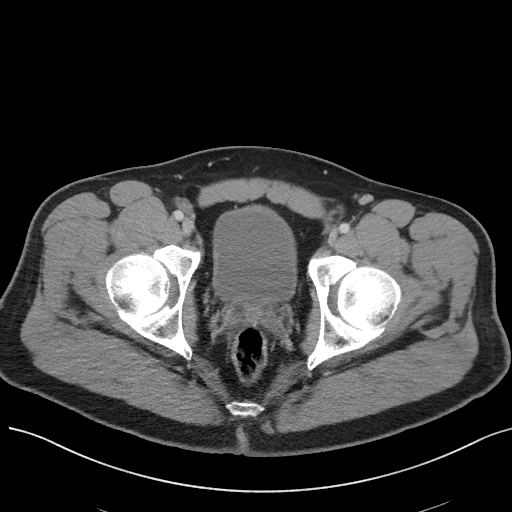
[im 28/104  soft-tissue]
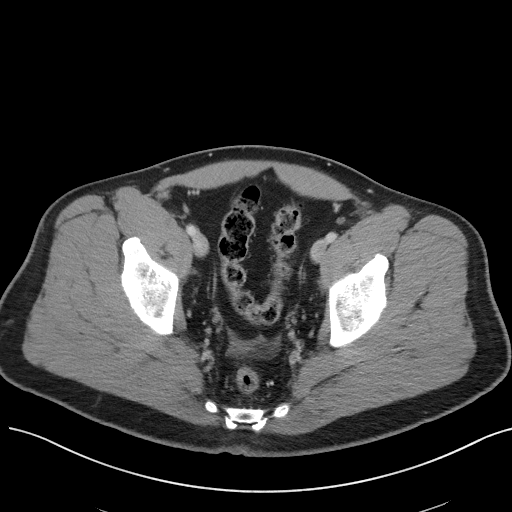
[im 38/104  soft-tissue]
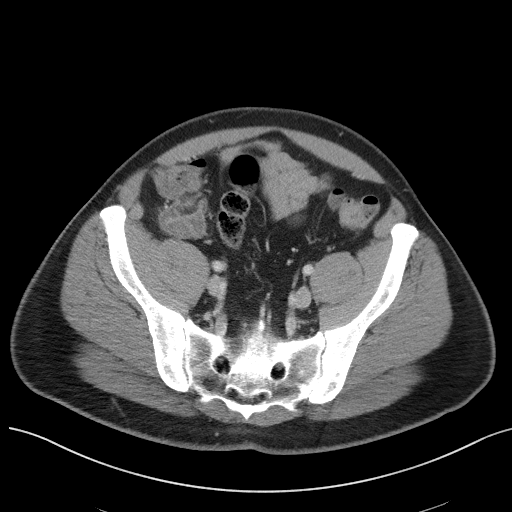
[im 44/104  soft-tissue]
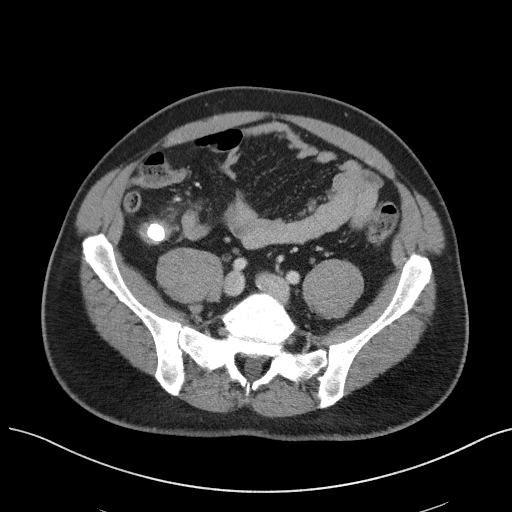
[im 55/104  soft-tissue]
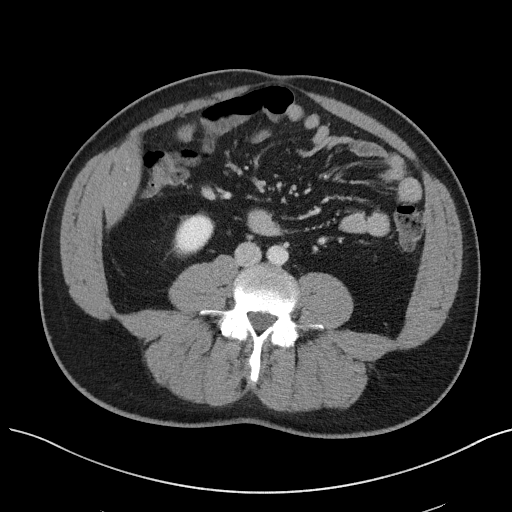
[im 60/104  soft-tissue]
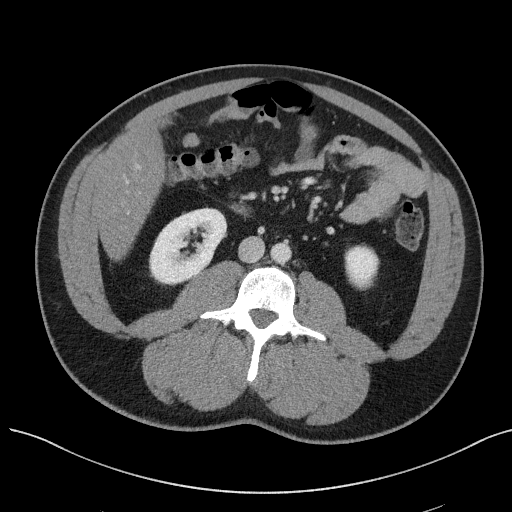
[im 66/104  soft-tissue]
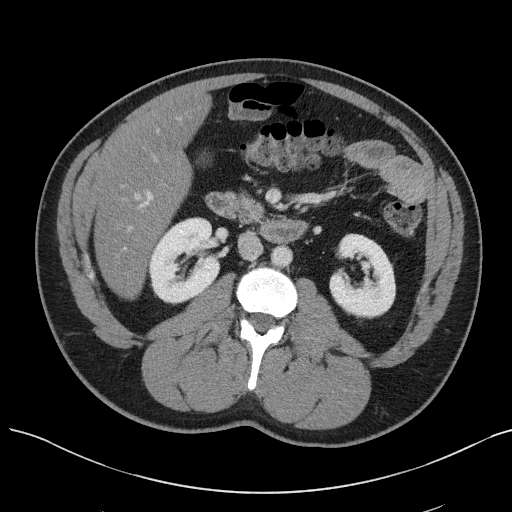
[im 66/104  bone]
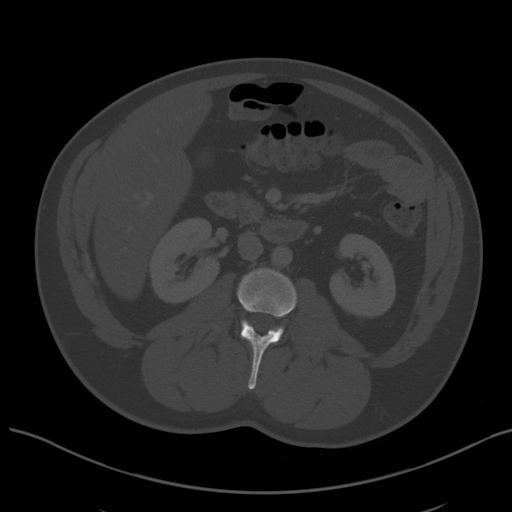
[im 76/104  soft-tissue]
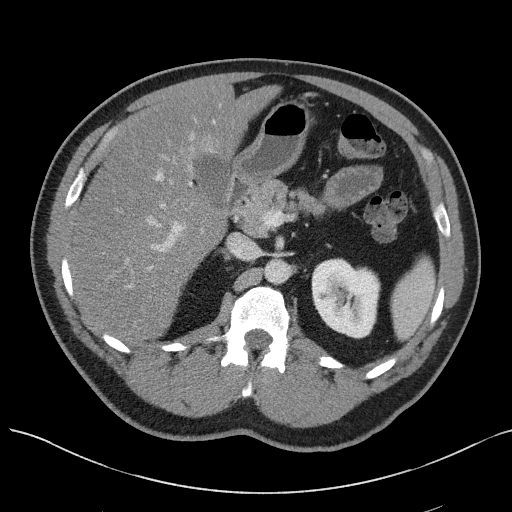
[im 82/104  soft-tissue]
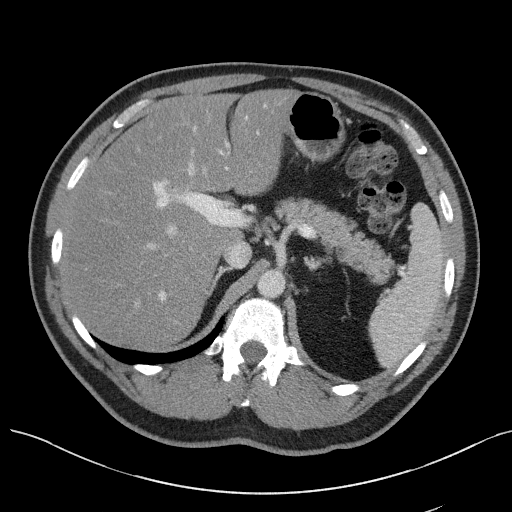
[im 87/104  soft-tissue]
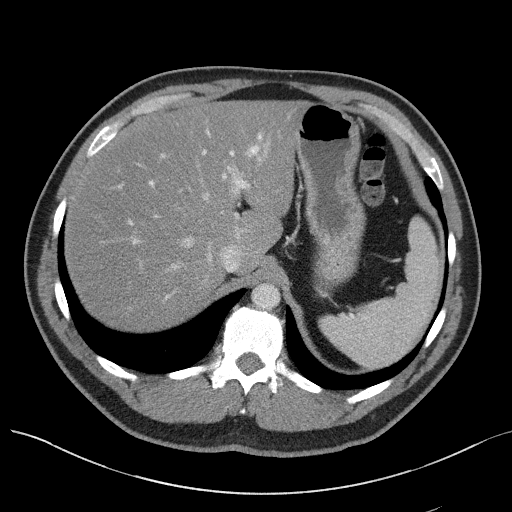
[im 98/104  soft-tissue]
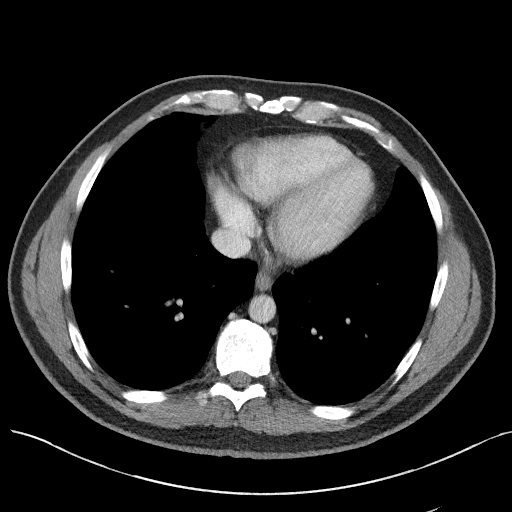

[Series 5: coronal st · coronal · 0.88mm/px · 3 of 101 slices shown]
[im 34/101  soft-tissue]
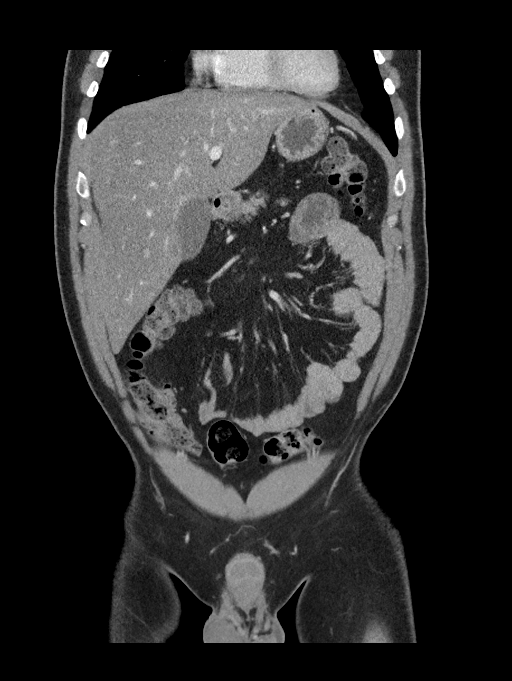
[im 45/101  soft-tissue]
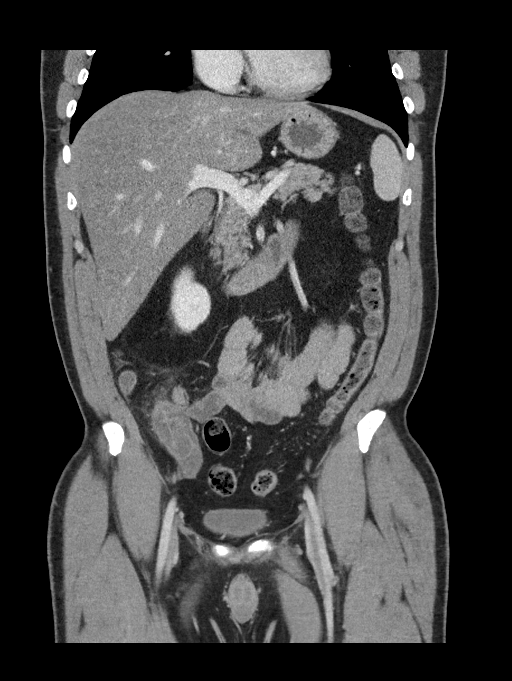
[im 56/101  soft-tissue]
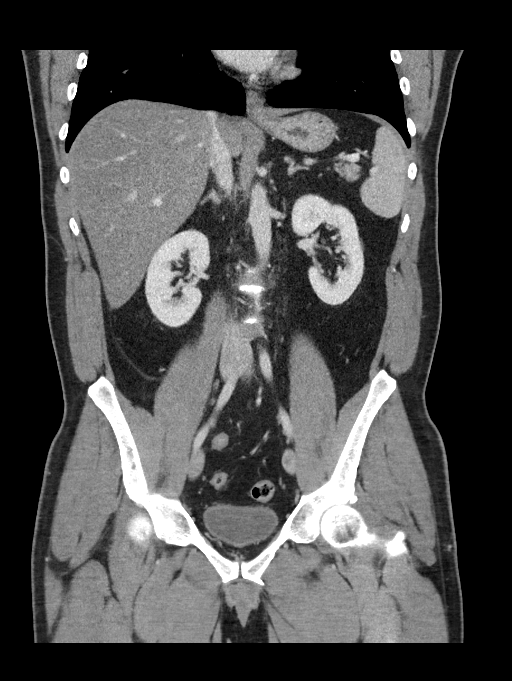

[16 of 46 positions shown; findings below may reference images not displayed]

FINDINGS: Lower chest: No acute abnormality.

Hepatobiliary: Diffuse low density of liver is identified. No focal
lesion is identified in the liver. The gallbladder and biliary tree
are normal.

Pancreas: Unremarkable. No pancreatic ductal dilatation or
surrounding inflammatory changes.

Spleen: Normal in size without focal abnormality.

Adrenals/Urinary Tract: The bilateral adrenal glands are normal.
There is a small cyst in the left kidney. There is no hydronephrosis
bilaterally. The bladder is normal.

Stomach/Bowel: The appendix is enlarged with several large
appendiculith, largest measures 1.9 cm. There is no small bowel
obstruction. The stomach is normal. The colon is normal.

Vascular/Lymphatic: No significant vascular findings are present. No
enlarged abdominal or pelvic lymph nodes.

Reproductive: Prostate is unremarkable.

Other: Minimal free fluid is identified in the pelvis.

Musculoskeletal: Mild degenerative joint changes of the spine are
noted.
IMPRESSION: 1. The appendix is enlarged with several large appendiculith,
largest measures 1.9 cm. The findings are consistent with acute
appendicitis.
2. Fatty infiltration of liver.

## 2022-03-15 ENCOUNTER — Other Ambulatory Visit (HOSPITAL_BASED_OUTPATIENT_CLINIC_OR_DEPARTMENT_OTHER): Payer: Self-pay

## 2022-03-15 DIAGNOSIS — R0681 Apnea, not elsewhere classified: Secondary | ICD-10-CM

## 2022-07-04 ENCOUNTER — Encounter: Payer: Self-pay | Admitting: *Deleted

## 2022-07-05 ENCOUNTER — Ambulatory Visit: Payer: Managed Care, Other (non HMO) | Admitting: Internal Medicine

## 2022-07-05 ENCOUNTER — Encounter: Payer: Self-pay | Admitting: Internal Medicine

## 2022-07-05 VITALS — BP 126/82 | Ht 72.0 in | Wt 227.0 lb

## 2022-07-05 DIAGNOSIS — R1013 Epigastric pain: Secondary | ICD-10-CM

## 2022-07-05 DIAGNOSIS — R1110 Vomiting, unspecified: Secondary | ICD-10-CM | POA: Diagnosis not present

## 2022-07-05 DIAGNOSIS — K219 Gastro-esophageal reflux disease without esophagitis: Secondary | ICD-10-CM | POA: Diagnosis not present

## 2022-07-05 MED ORDER — PANTOPRAZOLE SODIUM 40 MG PO TBEC: 40.0000 mg | DELAYED_RELEASE_TABLET | Freq: Two times a day (BID) | ORAL | 3 refills | Status: DC

## 2022-07-05 NOTE — Progress Notes (Signed)
Patient ID: Jermaine Cross, male   DOB: Jun 17, 1975, 47 y.o.   MRN: [id number] HPI: Jermaine Cross is a 47 year old male with a history of GERD and dyspepsia, hyperlipidemia who is seen to evaluate his reflux symptoms.  He is here alone today.  He reports that he has had at least 2 decades of dyspepsia and GERD type symptoms.  He recalls taking 300 mg of ranitidine for almost 20 years which worked well for him.  This medication was then taken off the market.  Most recently he is taking over-the-counter Prilosec 20 mg a day.  He deals with frequent nocturnal regurgitation.  He will wake up literally spitting fluid from his stomach with throat clearing.  He will drink milk or Pepto-Bismol at this point.  This does not occur during the day.  He is not having dysphagia or odynophagia.  He is also not having pyrosis during the day.  He does deal with lifelong abdominal bloating and lower gas.  He typically has a bowel movement every 4 to 5 days and on the days that he has bowel movements he will have 3-4 soft but formed stools.  He has seen the black stools which he now attributes to the Pepto-Bismol but no visible blood.  He does have occasional nausea.  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 He is married with children He does not use tobacco now or ever.  Appendectomy, uneventful in 2020  Past Medical History:  Diagnosis Date   GERD (gastroesophageal reflux disease)    High cholesterol    Insomnia     Past Surgical History:  Procedure Laterality Date   LAPAROSCOPIC APPENDECTOMY N/A 02/22/2019   Procedure: APPENDECTOMY LAPAROSCOPIC;  Surgeon: Romie Levee, MD;  Location: [facility] ORS;  Service: General;  Laterality: N/A;    Outpatient Medications Prior to Visit  Medication Sig Dispense Refill   Dexmethylphenidate HCl 40 MG CP24 Take 1 capsule by mouth daily.     ibuprofen (ADVIL) 800 MG tablet Take 1 tablet (800 mg total) by  mouth every 8 (eight) hours as needed. 30 tablet 0   traZODone (DESYREL) 50 MG tablet Take 20 mg by mouth at bedtime.     omeprazole (PRILOSEC OTC) 20 MG tablet Take 20 mg by mouth daily.     famotidine (PEPCID) 20 MG tablet Take 40 mg by mouth at bedtime.  (Patient not taking: Reported on 07/05/2022)     HYDROcodone-acetaminophen (NORCO/VICODIN) 5-325 MG tablet Take 1 tablet by mouth every 6 (six) hours as needed for moderate pain. (Patient not taking: Reported on 07/05/2022) 20 tablet 0   No facility-administered medications prior to visit.    No Known Allergies  Family History  Problem Relation Age of Onset   Diabetes type II Mother    Cirrhosis Mother    Heart disease Father    Colon polyps Father     Social History   Tobacco Use   Smoking status: Former   Smokeless tobacco: Never  Substance Use Topics   Alcohol use: Not Currently   Drug use: Never    ROS: As per history of present illness, otherwise negative  BP 126/82   Ht 6' (1.829 m)   Wt 227 lb (103 kg)   BMI 30.79 kg/m  Gen: awake, alert, NAD HEENT: anicteric  CV: RRR, no mrg Pulm: CTA b/l Abd: soft, NT/ND, +BS throughout Ext: no c/c/e Neuro: nonfocal   ASSESSMENT/PLAN: 47 year old male with a history  of GERD and dyspepsia, hyperlipidemia who is seen to evaluate his reflux symptoms.   GERD/dyspepsia/regurgitation --symptoms consistent with reflux disease which we discussed today.  We need to evaluate for esophagitis, exclude Barrett's esophagus as well as anatomic causes for reflux such as hiatal hernia.  Upper endoscopy recommended.  We discussed the risk, benefits and alternatives and he is agreeable and wishes to proceed -- EGD and LEC -- Discontinue omeprazole -- Pantoprazole 40 mg twice daily AC until upper endoscopy  2.  Colon cancer screening --colonoscopy recommended later this year after he turns 47 on 10/13/2022

## 2022-07-05 NOTE — Patient Instructions (Signed)
_______________________________________________________  If your blood pressure at your visit was 140/90 or greater, please contact your primary care physician to follow up on this.  If you are age 45 or younger, your body mass index should be between 19-25. Your Body mass index is 30.79 kg/m. If this is out of the aformentioned range listed, please consider follow up with your Primary Care Provider.  ________________________________________________________  The [facility] providers would like to encourage you to use [facility] to communicate with providers for non-urgent requests or questions.  Due to long hold times on the telephone, sending your provider a message by [facility] may be a faster and more efficient way to get a response.  Please allow 48 business hours for a response.  Please remember that this is for non-urgent requests.  _______________________________________________________  DISCONTINUE: omeprazole  We have sent the following medications to your pharmacy for you to pick up at your convenience:  START: pantoprazole  one tablet twice daily 30-60 minutes prior to meals.  You will need a recall colonoscopy in September 2024. We will contact you to get this scheduled.  You have been scheduled for an endoscopy. Please follow written instructions given to you at your visit today. If you use inhalers (even only as needed), please bring them with you on the day of your procedure.  Due to recent changes in healthcare laws, you may see the results of your imaging and laboratory studies on MyChart before your provider has had a chance to review them.  We understand that in some cases there may be results that are confusing or concerning to you. Not all laboratory results come back in the same time frame and the provider may be waiting for multiple results in order to interpret others.  Please give [place] 48 hours in order for your provider to thoroughly review all the results before  contacting the office for clarification of your results.

## 2022-07-11 ENCOUNTER — Telehealth: Payer: Self-pay | Admitting: Internal Medicine

## 2022-07-11 ENCOUNTER — Encounter: Payer: Managed Care, Other (non HMO) | Admitting: Internal Medicine

## 2022-07-11 NOTE — Telephone Encounter (Signed)
Called pt regarding insurance. Pt stated his son was sick and he was not feeling good either. Tried to get him R/S but the pt stated he would call back this week.

## 2022-10-02 ENCOUNTER — Other Ambulatory Visit: Payer: Self-pay | Admitting: Internal Medicine

## 2023-02-01 ENCOUNTER — Encounter: Payer: Self-pay | Admitting: Internal Medicine

## 2023-10-18 ENCOUNTER — Other Ambulatory Visit: Payer: Self-pay | Admitting: Internal Medicine
